# Patient Record
Sex: Female | Born: 1985 | Race: Black or African American | Hispanic: No | Marital: Single
Health system: Southern US, Community
[De-identification: ages and names within clinical notes are randomized; demographics above are authoritative.]

---

## 2020-02-13 ENCOUNTER — Emergency Department (HOSPITAL_COMMUNITY)
Admission: EM | Admit: 2020-02-13 | Discharge: 2020-02-14 | Disposition: A | Discharge status: Home / Self Care | Payer: 59 | Attending: Emergency Medicine | Admitting: Emergency Medicine

## 2020-02-13 ENCOUNTER — Other Ambulatory Visit: Payer: Self-pay

## 2020-02-13 ENCOUNTER — Emergency Department (HOSPITAL_COMMUNITY): Payer: 59

## 2020-02-13 DIAGNOSIS — M5126 Other intervertebral disc displacement, lumbar region: Secondary | ICD-10-CM | POA: Diagnosis not present

## 2020-02-13 DIAGNOSIS — M5136 Other intervertebral disc degeneration, lumbar region: Secondary | ICD-10-CM

## 2020-02-13 DIAGNOSIS — M545 Low back pain: Secondary | ICD-10-CM | POA: Diagnosis present

## 2020-02-13 LAB — COMPREHENSIVE METABOLIC PANEL
ALT: 16 U/L (ref 0–44)
AST: 14 U/L — ABNORMAL LOW (ref 15–41)
Albumin: 4.1 g/dL (ref 3.5–5.0)
Alkaline Phosphatase: 59 U/L (ref 38–126)
Anion gap: 16 — ABNORMAL HIGH (ref 5–15)
BUN: 13 mg/dL (ref 6–20)
CO2: 22 mmol/L (ref 22–32)
Calcium: 9.6 mg/dL (ref 8.9–10.3)
Chloride: 100 mmol/L (ref 98–111)
Creatinine, Ser: 0.61 mg/dL (ref 0.44–1.00)
GFR calc Af Amer: >60 mL/min (ref >60)
GFR calc non Af Amer: >60 mL/min (ref >60)
Glucose, Bld: 97 mg/dL (ref 70–99)
Potassium: 3.5 mmol/L (ref 3.5–5.1)
Sodium: 138 mmol/L (ref 135–145)
Total Bilirubin: 0.4 mg/dL (ref 0.3–1.2)
Total Protein: 8.6 g/dL — ABNORMAL HIGH (ref 6.5–8.1)

## 2020-02-13 LAB — CBC
HCT: 40.3 % (ref 36.0–46.0)
Hemoglobin: 12.8 g/dL (ref 12.0–15.0)
MCH: 28.4 pg (ref 26.0–34.0)
MCHC: 31.8 g/dL (ref 30.0–36.0)
MCV: 89.4 fL (ref 80.0–100.0)
Platelets: 355 10*3/uL (ref 150–400)
RBC: 4.51 MIL/uL (ref 3.87–5.11)
RDW: 14.1 % (ref 11.5–15.5)
WBC: 9.3 10*3/uL (ref 4.0–10.5)
nRBC: 0 % (ref 0.0–0.2)

## 2020-02-13 LAB — I-STAT BETA HCG BLOOD, ED (MC, WL, AP ONLY): I-stat hCG, quantitative: <5 m[IU]/mL (ref <5)

## 2020-02-13 LAB — LIPASE, BLOOD: Lipase: 19 U/L (ref 11–51)

## 2020-02-13 MED ORDER — KETOROLAC TROMETHAMINE 30 MG/ML IJ SOLN
30.0000 mg | Freq: Once | INTRAMUSCULAR | Status: AC
  Administered 2020-02-14: 30 mg via INTRAVENOUS
  Filled 2020-02-13: qty 1

## 2020-02-13 MED ORDER — HYDROMORPHONE HCL 1 MG/ML IJ SOLN
1.0000 mg | Freq: Once | INTRAMUSCULAR | Status: AC
  Administered 2020-02-14: 1 mg via INTRAVENOUS
  Filled 2020-02-13: qty 1

## 2020-02-13 MED ORDER — METHOCARBAMOL 1000 MG/10ML IJ SOLN: 1000.0000 mg | Freq: Once | INTRAMUSCULAR | Status: DC

## 2020-02-13 MED ORDER — METHOCARBAMOL 1000 MG/10ML IJ SOLN
1000.0000 mg | Freq: Once | INTRAVENOUS | Status: AC
  Administered 2020-02-14: 1000 mg via INTRAVENOUS
  Filled 2020-02-13: qty 10

## 2020-02-13 MED ORDER — SODIUM CHLORIDE 0.9% FLUSH: 3.0000 mL | Freq: Once | INTRAVENOUS | Status: DC

## 2020-02-13 NOTE — ED Triage Notes (Signed)
Patient arrived stating over a week ago she was bending over a heard a pop. Recently seen at Urgent care and given a muscle relaxer with little relief. Also seen by her chiropractor with no relief.   Patient states she has also had some abdominal pain. Declines NVD.

## 2020-02-13 NOTE — ED Provider Notes (Signed)
[facility]-EMERGENCY DEPT Provider Note   CSN: [id number] Arrival date & time: 02/13/20  1841     History Chief Complaint  Patient presents with  . Back Pain    Kristina Guzman is a 34 y.o. female with no significant past medical history who presents to the emergency department with chief complaint of back pain.  The patient reports that approximately 9 days ago that she was cleaning out her car when she bent over and lifted a heavy box out of the trunk.  She reports that she immediately heard and felt a "pop" in her low back accompanied by sudden onset, sharp, severe back pain.  She reports that the intensity of the pain caused her to fall and land on her buttocks.  She denies hitting her head, LOC, nausea, or vomiting.  She reports that she was able to stand up and was ambulatory.  Pain seemed manageable at that time, but she ended up having a 3-hour drive later in the day and pain was much worse after the drive.  She reports that the following day that she attempted to rest and allow her pain to improve.  However, she reports that she had significant difficulty getting off the toilet and pain was worse with walking.  Then, 7 days ago she was seen in urgent care.  She had an x-ray of the lumbar spine that was unremarkable.  She was treated with Decadron, Robaxin, and given a 6-day course of prednisone.  She reports that she follow the regimen and has since completed the entire course of Robaxin and prednisone, but has had no and improvement in her symptoms.  She reports that she also scheduled an appointment with a chiropractor who did an adjustment and gave her a shoe insert as she was told that one of her legs was slightly shorter than the other one.  She reports that after the adjustment she initially felt brief, intermittent improvement, but reports that after she sat down in the car on the ride home that by the time that she got out of the car at home that her pain was not  improved.   She reports that pain has been constant and sharp in her low back.  She reports that although it is bilateral, that the pain radiates down her left hip into the lateral aspect of her left thigh as well as into her left groin.  No radiating pain on the right.  She denies saddle paresthesias, urinary or fecal incontinence, numbness, fevers, chills, abdominal pain, nausea, vomiting, diarrhea, or GU complaints.  She does feel as if her left leg has been more weak on the left, but she is uncertain if this is due to the intensity of the pain.  No other focal weakness.  She reports that she did have a history of sciatica several years ago, but denies previous back surgeries.  She reports that she is also been trying to ice, stretch, and has taken Tylenol and ibuprofen with no improvement in her symptoms.  No history of cancer.  No history of IV drug use.  The history is provided by the patient. No language interpreter was used.       History reviewed. No pertinent past medical history.  There are no problems to display for this patient.    OB History   No obstetric history on file.     No family history on file.  Social History   Tobacco Use  . Smoking status: Not  on file  Substance Use Topics  . Alcohol use: Not on file  . Drug use: Not on file    Home Medications Prior to Admission medications   Medication Sig Start Date End Date Taking? Authorizing Provider  cyclobenzaprine (FLEXERIL) 10 MG tablet Take 1 tablet (10 mg total) by mouth 2 (two) times daily as needed for muscle spasms. 02/14/20   Keymoni Mccaster A, PA-C  oxyCODONE (ROXICODONE) 5 MG immediate release tablet Take 1 tablet (5 mg total) by mouth every 6 (six) hours as needed for severe pain. 02/14/20   Manal Kreutzer A, PA-C  predniSONE (STERAPRED UNI-PAK 21 TAB) 10 MG (21) TBPK tablet Take by mouth daily. Take 6 tabs by mouth daily  for 2 days, then 5 tabs for 2 days, then 4 tabs for 2 days, then 3 tabs for 2 days, 2  tabs for 2 days, then 1 tab by mouth daily for 2 days 02/14/20   Carden Teel A, PA-C    Allergies    Patient has no known allergies.  Review of Systems   Review of Systems  Constitutional: Negative for activity change, chills and fever.  HENT: Negative for congestion, sinus pain and sore throat.   Respiratory: Negative for cough, shortness of breath and wheezing.   Cardiovascular: Negative for chest pain and palpitations.  Gastrointestinal: Negative for abdominal pain, diarrhea, nausea and vomiting.  Genitourinary: Negative for dysuria, flank pain, frequency, hematuria, menstrual problem, pelvic pain, urgency, vaginal bleeding, vaginal discharge and vaginal pain.  Musculoskeletal: Positive for arthralgias, back pain, gait problem and myalgias. Negative for joint swelling, neck pain and neck stiffness.  Skin: Negative for rash and wound.  Allergic/Immunologic: Negative for immunocompromised state.  Neurological: Positive for weakness (left leg). Negative for dizziness, seizures, syncope, light-headedness, numbness and headaches.  Psychiatric/Behavioral: Negative for confusion.    Physical Exam Updated Vital Signs BP (!) 165/95   Pulse 80   Temp 98.6 F (37 C) (Oral)   Resp 19   Ht 5\' 9"  (1.753 m)   Wt (!) 163.3 kg   SpO2 100%   BMI 53.16 kg/m   Physical Exam Vitals and nursing note reviewed.  Constitutional:      General: She is not in acute distress.    Appearance: She is obese. She is not ill-appearing, toxic-appearing or diaphoretic.     Comments: Appears uncomfortable.  Screams in pain with any positional changes.  HENT:     Head: Normocephalic.  Eyes:     Conjunctiva/sclera: Conjunctivae normal.  Cardiovascular:     Rate and Rhythm: Normal rate and regular rhythm.     Heart sounds: No murmur heard.  No friction rub. No gallop.   Pulmonary:     Effort: Pulmonary effort is normal. No respiratory distress.  Abdominal:     General: There is no distension.      Palpations: Abdomen is soft. There is no mass.     Tenderness: There is no abdominal tenderness. There is no right CVA tenderness, left CVA tenderness, guarding or rebound.     Hernia: No hernia is present.     Comments: Abdomen is obese, but soft, nontender, nondistended.  Normoactive bowel sounds.  No CVA tenderness bilaterally.  Musculoskeletal:        General: Tenderness present. No deformity or signs of injury.     Cervical back: Neck supple.     Right lower leg: No edema.     Left lower leg: No edema.     Comments: Tenderness  to palpation to the spinous processes of the lumbar spine.  No crepitus or step-offs.  There is also left-sided paraspinal muscle tenderness.  No right-sided paraspinal muscle tenderness.  Cervical and thoracic spine are nontender.  She has a positive straight leg raise at approximately 10 degrees on the left and 45 degrees on the right.  Skin:    General: Skin is warm.     Findings: No rash.  Neurological:     Mental Status: She is alert.     Comments: Sensation is intact and equal throughout the bilateral upper and lower extremities.  5/5 strength of all muscle groups in the right lower extremity.  4 out of 5 strength against resistance of the muscle groups of the left lower extremity.  No clonus bilaterally.  2+ DTRs.  Psychiatric:        Behavior: Behavior normal.     ED Results / Procedures / Treatments   Labs (all labs ordered are listed, but only abnormal results are displayed) Labs Reviewed  COMPREHENSIVE METABOLIC PANEL - Abnormal; Notable for the following components:      Result Value   Total Protein 8.6 (*)    AST 14 (*)    Anion gap 16 (*)    All other components within normal limits  LIPASE, BLOOD  CBC  LACTIC ACID, PLASMA  URINALYSIS, ROUTINE W REFLEX MICROSCOPIC  I-STAT BETA HCG BLOOD, ED ([facility], [facility], [facility] ONLY)    EKG None  Radiology CT Lumbar Spine Wo Contrast  Result Date: 02/14/2020 CLINICAL DATA:  Initial evaluation for acute low  back pain for 1 week. EXAM: CT LUMBAR SPINE WITHOUT CONTRAST TECHNIQUE: Multidetector CT imaging of the lumbar spine was performed without intravenous contrast administration. Multiplanar CT image reconstructions were also generated. COMPARISON:  None. FINDINGS: Segmentation: Standard. Lowest well-formed disc space labeled the L5-S1 level. Alignment: Physiologic with preservation of the normal lumbar lordosis. No listhesis. Vertebrae: Vertebral body height maintained without evidence for acute or chronic fracture. Visualized sacrum and pelvis intact. SI joints approximated symmetric. No discrete or worrisome osseous lesions. Paraspinal and other soft tissues: Paraspinous soft tissues demonstrate no acute finding. Visualized visceral structures unremarkable. Disc levels: No significant findings are seen through the L4-5 level. L5-S1: Central to right subarticular disc protrusion is seen (series 4, image 99). Protruding disc closely approximates the descending S1 nerve roots, potentially affecting either, particularly on the right. No significant spinal stenosis. Foramina remain grossly patent. IMPRESSION: 1. Central to right subarticular disc protrusion at L5-S1, closely approximating and potentially affecting either the descending S1 nerve roots, particularly on the right. 2. No other acute abnormality within the lumbar spine. Electronically Signed   By: Rise Mu M.D.   On: 02/14/2020 00:35    Procedures Procedures (including critical care time)  Medications Ordered in ED Medications  HYDROmorphone (DILAUDID) injection 1 mg (1 mg Intravenous Given 02/14/20 0005)  ketorolac (TORADOL) 30 MG/ML injection 30 mg (30 mg Intravenous Given 02/14/20 0007)  methocarbamol (ROBAXIN) 1,000 mg in dextrose 5 % 100 mL IVPB (0 mg Intravenous Stopped 02/14/20 0051)  predniSONE (DELTASONE) tablet 60 mg (60 mg Oral Given 02/14/20 0117)  diazepam (VALIUM) injection 10 mg (10 mg Intravenous Given 02/14/20 0118)     ED Course  I have reviewed the triage vital signs and the nursing notes.  Pertinent labs & imaging results that were available during my care of the patient were reviewed by me and considered in my medical decision making (see chart for details).  MDM Rules/Calculators/A&P                          34 year old female with a history of anxiety and no other chronic medical conditions who presents the emergency department with a chief complaint of back pain. The patient reports that she lifted a box out of her trunk approximately 9 days ago and felt a pop in her back with sudden onset pain that caused her to fall to the ground. She has been evaluated by a chiropractor and by urgent care over the last week and has been treating her symptoms with Tylenol, ibuprofen, Robaxin, a 6-day course of prednisone with minimal improvement in her symptoms.  On exam, she has a 4 out of 5 strength against resistance of the large muscle groups of the left lower extremities versus 5-5 on the right. No numbness. No other neurologic deficits on exam. She appears markedly uncomfortable and screams out in pain with any positional changes. She had a negative x-ray at urgent care and at the chiropractor of the lumbar spine. I am concerned for a bulging disc given her presentation. Will order CT scan of the lumbar spine.  CT with central to right subarticular disc protrusion L5-S1 that closely approximates and is possibly affecting either the descending S1 nerve roots, particularly on the right. No other acute abnormalities. Discussed these findings with the patient and will provide her with a referral to neurosurgery.  Initially treated patient's pain with Dilaudid, Toradol, and Robaxin with some improvement in pain, but she continued to endorse severe muscle spasms.  IV Valium given.  On reevaluation, patient reported significant improvement in her pain.  She was able to independently stand and walk following treatment.   She reported that she was feeling much better.  Doubt cauda equina, spinal cord compression, epidural abscess, intra-abdominal infection.  We will discharge home with pain control and neurosurgery follow-up.  Will give her a long taper of prednisone and Flexeril for muscle spasms.  ER return precautions given.  She is hemodynamically stable and in no acute distress.  Safe for discharge to home with outpatient follow-up as indicated.  Final Clinical Impression(s) / ED Diagnoses Final diagnoses:  Bulging lumbar disc    Rx / DC Orders ED Discharge Orders         Ordered    oxyCODONE (ROXICODONE) 5 MG immediate release tablet  Every 6 hours PRN     Discontinue  Reprint     02/14/20 0259    predniSONE (STERAPRED UNI-PAK 21 TAB) 10 MG (21) TBPK tablet  Daily     Discontinue  Reprint     02/14/20 0259    cyclobenzaprine (FLEXERIL) 10 MG tablet  2 times daily PRN     Discontinue  Reprint     02/14/20 0259           Jermain Curt A, PA-C 02/14/20 0403    Mesner, Barbara CowerJason, MD 02/14/20 [id number]

## 2020-02-14 ENCOUNTER — Encounter (HOSPITAL_COMMUNITY): Payer: Self-pay | Admitting: Emergency Medicine

## 2020-02-14 LAB — LACTIC ACID, PLASMA: Lactic Acid, Venous: 1.4 mmol/L (ref 0.5–1.9)

## 2020-02-14 MED ORDER — DIAZEPAM 5 MG/ML IJ SOLN
10.0000 mg | Freq: Once | INTRAMUSCULAR | Status: AC
  Administered 2020-02-14: 10 mg via INTRAVENOUS
  Filled 2020-02-14: qty 2

## 2020-02-14 MED ORDER — OXYCODONE HCL 5 MG PO TABS: 5.0000 mg | ORAL_TABLET | Freq: Four times a day (QID) | ORAL | 0 refills | Status: AC | PRN

## 2020-02-14 MED ORDER — PREDNISONE 10 MG (21) PO TBPK: ORAL_TABLET | Freq: Every day | ORAL | 0 refills | Status: AC

## 2020-02-14 MED ORDER — CYCLOBENZAPRINE HCL 10 MG PO TABS: 10.0000 mg | ORAL_TABLET | Freq: Two times a day (BID) | ORAL | 0 refills | Status: AC | PRN

## 2020-02-14 MED ORDER — PREDNISONE 20 MG PO TABS
60.0000 mg | ORAL_TABLET | Freq: Once | ORAL | Status: AC
  Administered 2020-02-14: 60 mg via ORAL
  Filled 2020-02-14: qty 3

## 2020-02-14 NOTE — Discharge Instructions (Signed)
Thank you for allowing me to care for you today in the Emergency Department.   Call to schedule a follow-up appointment with neurosurgery since your CT showed a bulging disc at L5-S1.  To manage your pain at home:  Take 650 mg of Tylenol or 600 mg of ibuprofen with food every 6 hours for pain.  You can alternate between these 2 medications every 3 hours if your pain returns.  For instance, you can take Tylenol at noon, followed by a dose of ibuprofen at 3, followed by second dose of Tylenol and 6.  I would recommend doing this around the clock for the next few days to help with inflammation.  For severe, uncontrollable pain, you can take 1 tablet of oxycodone every 6 hours as needed.  This medication is a narcotic.  It can be addicting.  You should not work or drive while taking this medication as it causes you to be impaired.  You should not drink alcohol or take other medications that can cause you to be very drowsy with this medication.  This medication will not interact with Tylenol or ibuprofen.  Starting tomorrow, take prednisone as prescribed.  Your first dose was given in the ER tonight.  Flexeril is a muscle relaxer.  You can take 1 tablet up to 2 times daily as needed for muscle spasms.  It can make you more sleepy so do not take it before you work or have to drive.  Start to stretch using the attached exercises as your pain allows.  Apply an ice pack for 15 to 20 minutes up to 3-4 times a day for the next 5 days.  You can alternate so try alternating between heat and ice for improvement in your symptoms.  You should return to the emergency department if you have episodes where you pee or poop on yourself, develop new numbness or weakness, develop tingling or numbness in your groin, start having high fevers, or other new, concerning symptoms.  If you return to the emergency department with these symptoms, I would recommend going to [facility] as you might need an MRI and they have an MRI  machine that is available 24 hours a day.

## 2020-02-14 NOTE — ED Notes (Signed)
Pt ambulated out of ED with no issues.

## 2020-02-14 NOTE — ED Notes (Signed)
Pt states that she's not ready to use the restroom at this time. She will let me know when has to void.

## 2020-02-14 NOTE — ED Notes (Signed)
Pt able to stand at bedside with no assistance.

## 2020-09-10 IMAGING — CT CT L SPINE W/O CM
3 of 4 series · 12 of 33 positions shown, 14 images · non-contrast
Comparison: None.

CLINICAL DATA: Initial evaluation for acute low back pain for 1
week.

EXAM:
CT LUMBAR SPINE WITHOUT CONTRAST
TECHNIQUE: Multidetector CT imaging of the lumbar spine was performed without
intravenous contrast administration. Multiplanar CT image
reconstructions were also generated.

[Series 4: l spine st · axial · 0.30mm/px · z∈[-353,-201]mm · 4 of 115 slices shown, 5 images]
[im 20/115  soft-tissue]
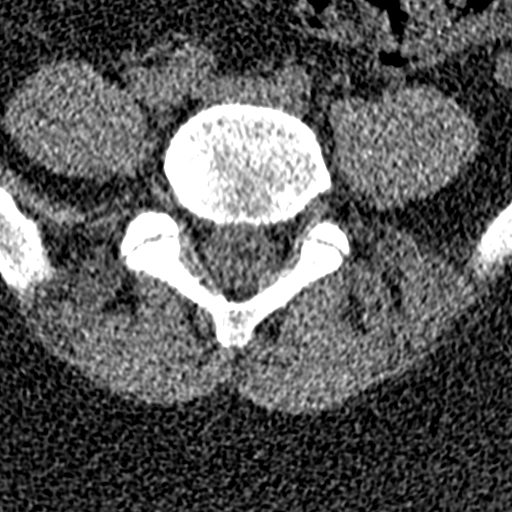
[im 20/115  bone]
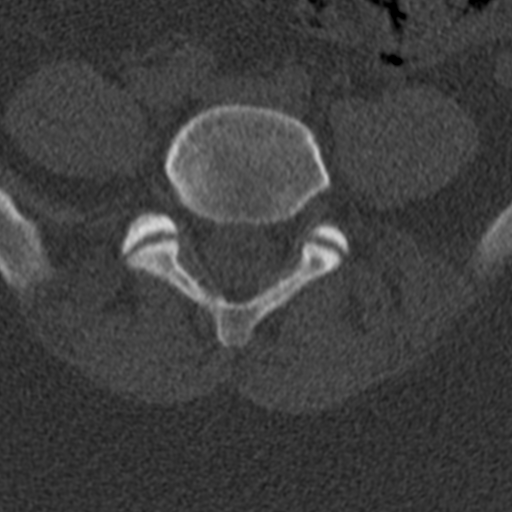
[im 39/115  bone]
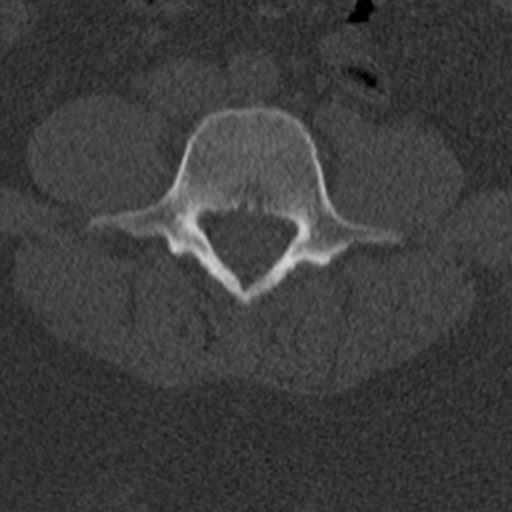
[im 77/115  bone]
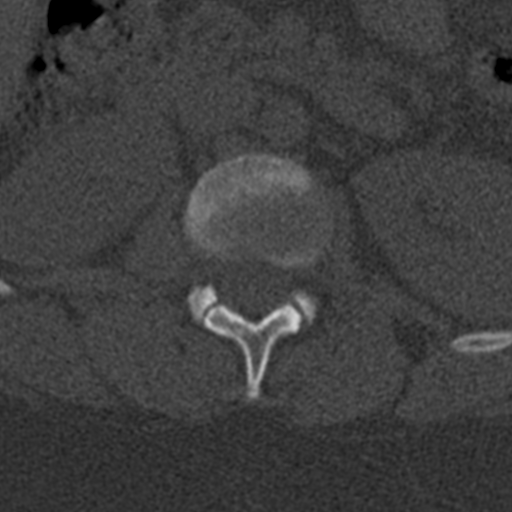
[im 96/115  bone]
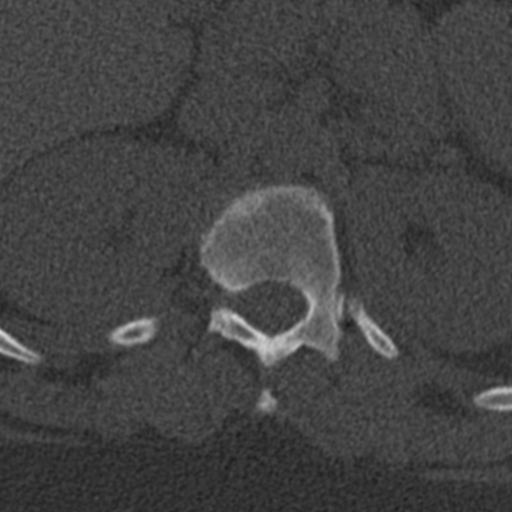

[Series 8: coronal bone · coronal · 0.29mm/px · 3 of 65 slices shown]
[im 13/65  bone]
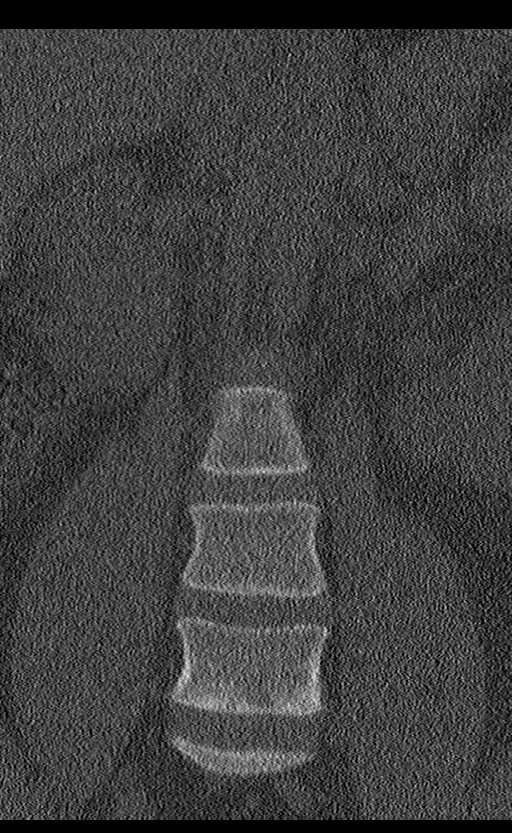
[im 26/65  bone]
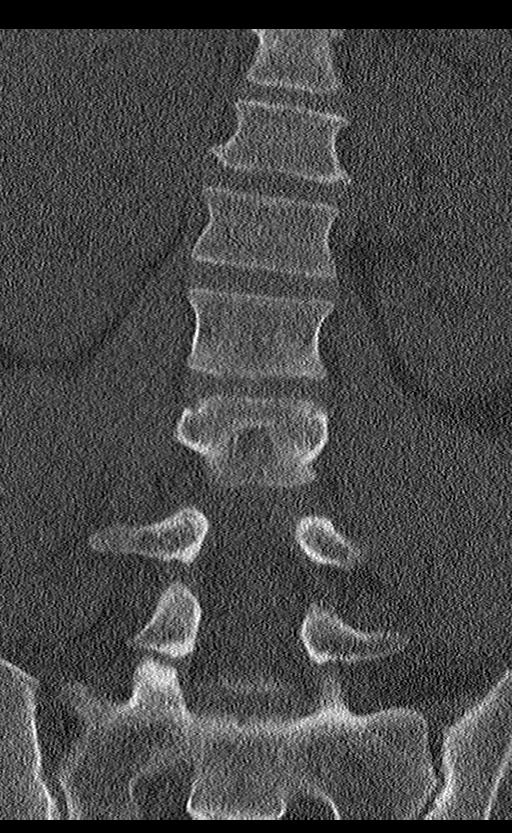
[im 39/65  bone]
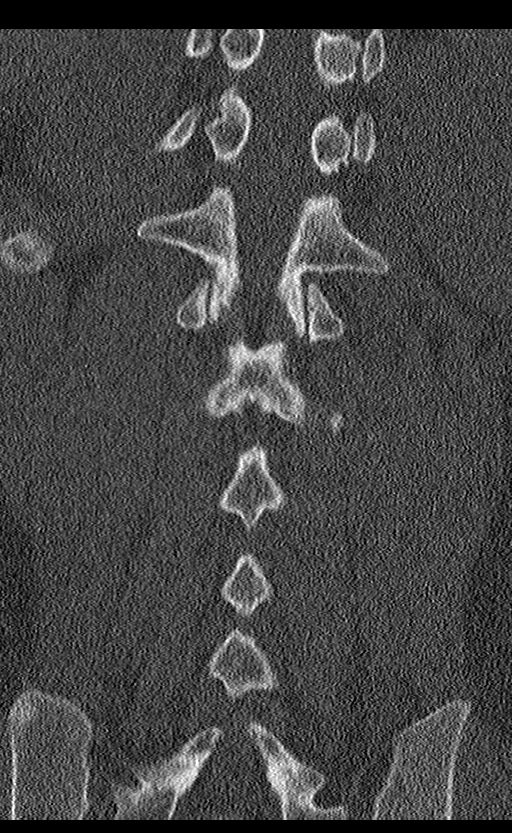

[Series 10: sagittal st · sagittal · 0.27mm/px · 5 of 61 slices shown, 6 images]
[im 21/61  bone]
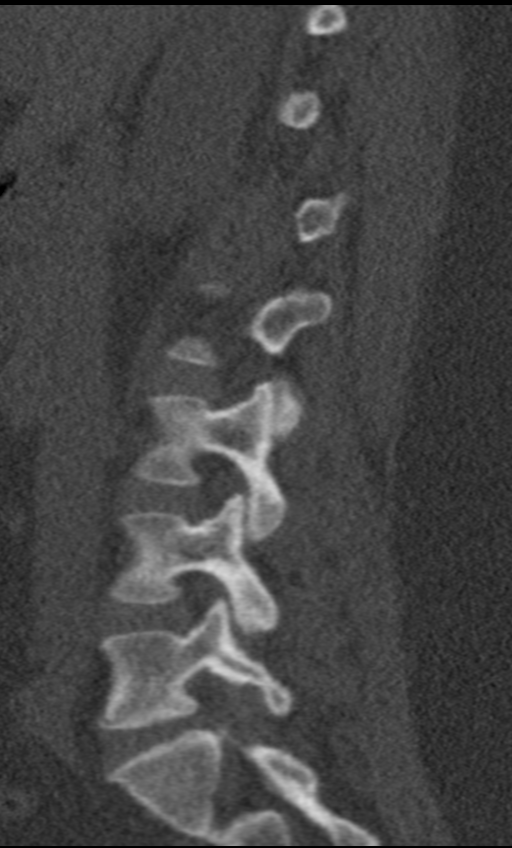
[im 26/61  bone]
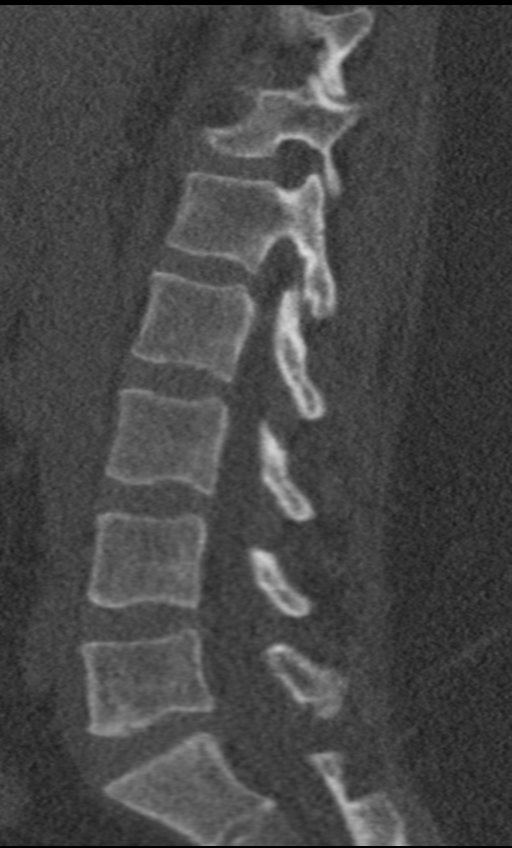
[im 31/61  soft-tissue]
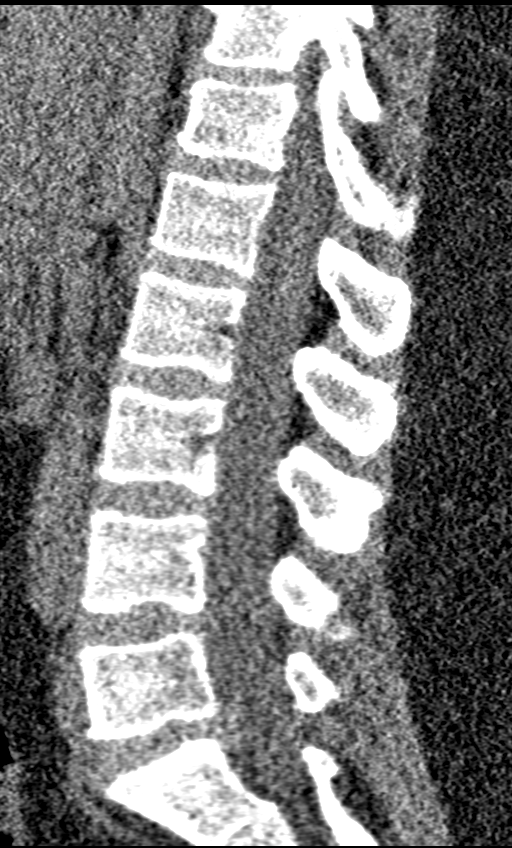
[im 31/61  bone]
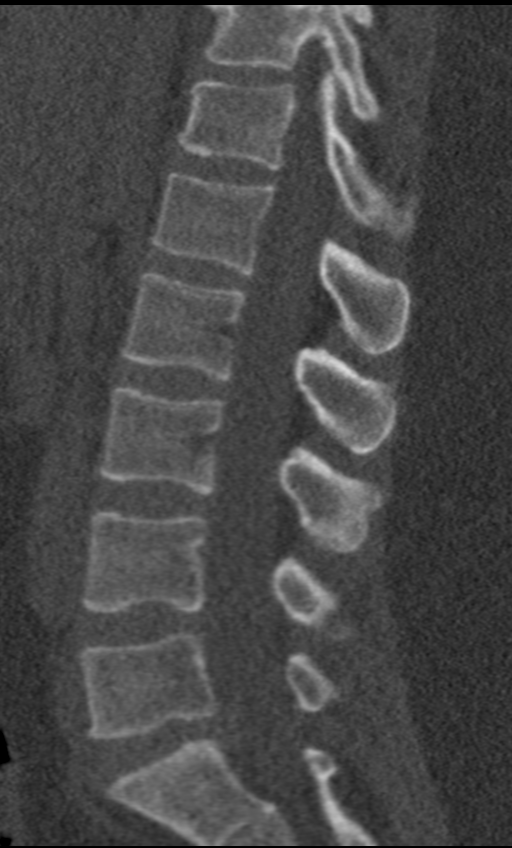
[im 36/61  bone]
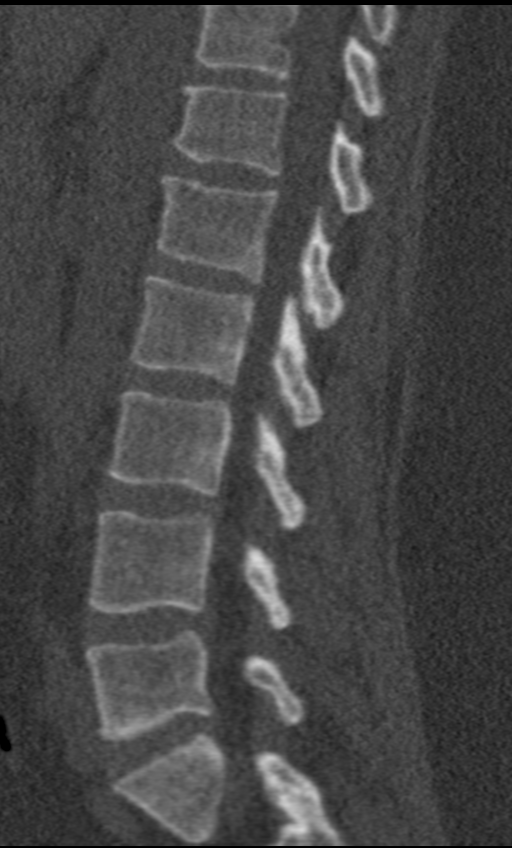
[im 41/61  bone]
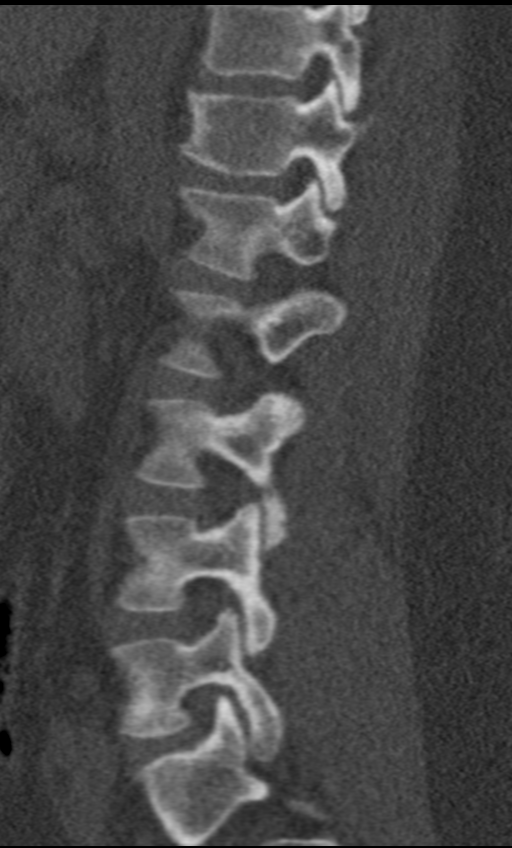

[12 of 33 positions shown; findings below may reference images not displayed]

FINDINGS: Segmentation: Standard. Lowest well-formed disc space labeled the
L5-S1 level.

Alignment: Physiologic with preservation of the normal lumbar
lordosis. No listhesis.

Vertebrae: Vertebral body height maintained without evidence for
acute or chronic fracture. Visualized sacrum and pelvis intact. SI
joints approximated symmetric. No discrete or worrisome osseous
lesions.

Paraspinal and other soft tissues: Paraspinous soft tissues
demonstrate no acute finding. Visualized visceral structures
unremarkable.

Disc levels: No significant findings are seen through the L4-5
level.

L5-S1: Central to right subarticular disc protrusion is seen (series
4, image 99). Protruding disc closely approximates the descending S1
nerve roots, potentially affecting either, particularly on the
right. No significant spinal stenosis. Foramina remain grossly
patent.
IMPRESSION: 1. Central to right subarticular disc protrusion at L5-S1, closely
approximating and potentially affecting either the descending S1
nerve roots, particularly on the right.
2. No other acute abnormality within the lumbar spine.

## 2024-06-04 ENCOUNTER — Ambulatory Visit
Admission: EM | Admit: 2024-06-04 | Discharge: 2024-06-04 | Disposition: A | Discharge status: Home / Self Care | Attending: Family Medicine | Admitting: Family Medicine

## 2024-06-04 DIAGNOSIS — H109 Unspecified conjunctivitis: Secondary | ICD-10-CM | POA: Diagnosis not present

## 2024-06-04 DIAGNOSIS — J309 Allergic rhinitis, unspecified: Secondary | ICD-10-CM

## 2024-06-04 MED ORDER — CETIRIZINE HCL 10 MG PO TABS: 10.0000 mg | ORAL_TABLET | Freq: Every day | ORAL | 0 refills | Status: AC

## 2024-06-04 MED ORDER — TOBRAMYCIN 0.3 % OP SOLN: 1.0000 [drp] | OPHTHALMIC | 0 refills | Status: AC

## 2024-06-04 MED ORDER — PSEUDOEPHEDRINE HCL 30 MG PO TABS: 30.0000 mg | ORAL_TABLET | Freq: Three times a day (TID) | ORAL | 0 refills | Status: AC | PRN

## 2024-06-04 MED ORDER — CETIRIZINE HCL 10 MG PO TABS: 10.0000 mg | ORAL_TABLET | Freq: Every day | ORAL | 0 refills | Status: DC

## 2024-06-04 MED ORDER — TOBRAMYCIN 0.3 % OP SOLN: 1.0000 [drp] | OPHTHALMIC | 0 refills | Status: DC

## 2024-06-04 MED ORDER — PSEUDOEPHEDRINE HCL 30 MG PO TABS: 30.0000 mg | ORAL_TABLET | Freq: Three times a day (TID) | ORAL | 0 refills | Status: DC | PRN

## 2024-06-04 NOTE — ED Triage Notes (Signed)
 Pt reports redness and pain x 1 day. Reports she saw a white line in the eye. Pt has not taken/applied any meds to eye.

## 2024-06-04 NOTE — ED Provider Notes (Signed)
 [facility] - URGENT [facility]ARE [facility]  Note:  This document was prepared using Conservation officer, historic buildings and may include unintentional dictation errors.  MRN: [id number] DOB: 07/07/1986  Subjective:   Kristina Guzman is a 38 y.o. female presenting for 1 day history of persistent redness, pain and white discoloration of the eye.  Patient tried over-the-counter eyedrops but feels like her eye got worse.  She has been crying and her eyes.  She has also felt some sinus pressure.  No eyelid pain, eyelid swelling.  No current facility-administered medications for this encounter.  Current Outpatient Medications:    Cholecalciferol (D3 2000 PO), Take by mouth., Disp: , Rfl:    cyanocobalamin (VITAMIN B12) 500 MCG tablet, Take 500 mcg by mouth daily., Disp: , Rfl:    Multiple Vitamins-Minerals (MULTIVITAMIN WITH MINERALS) tablet, Take 1 tablet by mouth daily., Disp: , Rfl:    cyclobenzaprine  (FLEXERIL ) 10 MG tablet, Take 1 tablet (10 mg total) by mouth 2 (two) times daily as needed for muscle spasms., Disp: 20 tablet, Rfl: 0   oxyCODONE  (ROXICODONE ) 5 MG immediate release tablet, Take 1 tablet (5 mg total) by mouth every 6 (six) hours as needed for severe pain., Disp: 16 tablet, Rfl: 0   predniSONE  (STERAPRED UNI-PAK 21 TAB) 10 MG (21) TBPK tablet, Take by mouth daily. Take 6 tabs by mouth daily  for 2 days, then 5 tabs for 2 days, then 4 tabs for 2 days, then 3 tabs for 2 days, 2 tabs for 2 days, then 1 tab by mouth daily for 2 days, Disp: 42 tablet, Rfl: 0   No Known Allergies  History reviewed. No pertinent past medical history.   History reviewed. No pertinent surgical history.  History reviewed. No pertinent family history.  Social History   Tobacco Use   Smoking status: Never   Smokeless tobacco: Never  Vaping Use   Vaping status: Never Used  Substance Use Topics   Alcohol use: Yes    Comment: Occa   Drug use: Never    ROS   Objective:   Vitals: BP 121/77 (BP  Location: Right Arm)   Pulse 80   Temp 98.6 F (37 C) (Oral)   Resp 16   SpO2 98%   Physical Exam Constitutional:      General: She is not in acute distress.    Appearance: Normal appearance. She is well-developed and normal weight. She is not ill-appearing, toxic-appearing or diaphoretic.  HENT:     Head: Normocephalic and atraumatic.     Right Ear: Tympanic membrane, ear canal and external ear normal. No drainage or tenderness. No middle ear effusion. There is no impacted cerumen. Tympanic membrane is not erythematous or bulging.     Left Ear: Tympanic membrane, ear canal and external ear normal. No drainage or tenderness.  No middle ear effusion. There is no impacted cerumen. Tympanic membrane is not erythematous or bulging.     Nose: Nose normal. No congestion or rhinorrhea.     Mouth/Throat:     Mouth: Mucous membranes are moist. No oral lesions.     Pharynx: No pharyngeal swelling, oropharyngeal exudate, posterior oropharyngeal erythema or uvula swelling.     Tonsils: No tonsillar exudate or tonsillar abscesses.  Eyes:     General: Lids are normal. Lids are everted, no foreign bodies appreciated. Vision grossly intact. No scleral icterus.       Right eye: No foreign body, discharge or hordeolum.        Left eye:  No foreign body, discharge or hordeolum.     Extraocular Movements: Extraocular movements intact.     Right eye: Normal extraocular motion.     Left eye: Normal extraocular motion and no nystagmus.     Conjunctiva/sclera: Conjunctivae normal.     Right eye: Right conjunctiva is not injected. No chemosis, exudate or hemorrhage.    Left eye: Left conjunctiva is not injected. No chemosis, exudate or hemorrhage.  Cardiovascular:     Rate and Rhythm: Normal rate.  Pulmonary:     Effort: Pulmonary effort is normal.  Musculoskeletal:     Cervical back: Normal range of motion and neck supple.  Lymphadenopathy:     Cervical: No cervical adenopathy.  Skin:    General: Skin  is warm and dry.  Neurological:     General: No focal deficit present.     Mental Status: She is alert and oriented to person, place, and time.  Psychiatric:        Mood and Affect: Mood normal.        Behavior: Behavior normal.     Eye Exam: Eyelids everted and swept for foreign body. The eye was stained with fluorescein. Examination under woods lamp does reveal an area of increased stain uptake as outlined. The eye was then irrigated copiously with saline.   Assessment and Plan :   PDMP not reviewed this encounter.  1. Bacterial conjunctivitis of left eye   2. Allergic rhinitis, unspecified seasonality, unspecified trigger    Recommended treating for bacterial conjunctivitis with tobramycin.  Follow-up urgently with Dr. Milford office as they are on-call through [facility].  Use Zyrtec and pseudoephedrine for allergic rhinitis.  Counseled patient on potential for adverse effects with medications prescribed/recommended today, ER and return-to-clinic precautions discussed, patient verbalized understanding.    Christopher Savannah, PA-C 06/04/24 1751
